# Patient Record
Sex: Female | Born: 1960 | Hispanic: Yes | Marital: Single | Smoking: Never smoker
Health system: Southern US, Community
[De-identification: ages and names within clinical notes are randomized; demographics above are authoritative.]

## PROBLEM LIST (undated history)

## (undated) DIAGNOSIS — Z789 Other specified health status: Secondary | ICD-10-CM

## (undated) DIAGNOSIS — G709 Myoneural disorder, unspecified: Secondary | ICD-10-CM

## (undated) HISTORY — DX: Other specified health status: Z78.9

## (undated) HISTORY — DX: Myoneural disorder, unspecified: G70.9

---

## 2018-12-15 ENCOUNTER — Other Ambulatory Visit: Payer: Self-pay | Admitting: Physician Assistant

## 2018-12-15 ENCOUNTER — Encounter (INDEPENDENT_AMBULATORY_CARE_PROVIDER_SITE_OTHER): Payer: Self-pay | Admitting: Vascular Surgery

## 2018-12-15 ENCOUNTER — Other Ambulatory Visit: Payer: Self-pay

## 2018-12-15 ENCOUNTER — Ambulatory Visit (INDEPENDENT_AMBULATORY_CARE_PROVIDER_SITE_OTHER): Payer: Medicaid Other | Admitting: Vascular Surgery

## 2018-12-15 ENCOUNTER — Encounter (INDEPENDENT_AMBULATORY_CARE_PROVIDER_SITE_OTHER): Payer: Self-pay

## 2018-12-15 DIAGNOSIS — M79605 Pain in left leg: Secondary | ICD-10-CM | POA: Diagnosis not present

## 2018-12-15 DIAGNOSIS — I739 Peripheral vascular disease, unspecified: Secondary | ICD-10-CM

## 2018-12-15 DIAGNOSIS — M79604 Pain in right leg: Secondary | ICD-10-CM | POA: Diagnosis not present

## 2018-12-15 DIAGNOSIS — G319 Degenerative disease of nervous system, unspecified: Secondary | ICD-10-CM

## 2018-12-18 ENCOUNTER — Other Ambulatory Visit (INDEPENDENT_AMBULATORY_CARE_PROVIDER_SITE_OTHER): Payer: Self-pay | Admitting: Vascular Surgery

## 2018-12-18 DIAGNOSIS — I739 Peripheral vascular disease, unspecified: Secondary | ICD-10-CM

## 2018-12-19 ENCOUNTER — Encounter (INDEPENDENT_AMBULATORY_CARE_PROVIDER_SITE_OTHER): Payer: Self-pay | Admitting: Nurse Practitioner

## 2018-12-19 ENCOUNTER — Other Ambulatory Visit: Payer: Self-pay

## 2018-12-19 ENCOUNTER — Ambulatory Visit (INDEPENDENT_AMBULATORY_CARE_PROVIDER_SITE_OTHER): Payer: Medicaid Other | Admitting: Nurse Practitioner

## 2018-12-19 ENCOUNTER — Ambulatory Visit (INDEPENDENT_AMBULATORY_CARE_PROVIDER_SITE_OTHER): Payer: Medicaid Other

## 2018-12-19 VITALS — BP 149/83 | HR 81 | Resp 16 | Wt 154.0 lb

## 2018-12-19 DIAGNOSIS — R600 Localized edema: Secondary | ICD-10-CM | POA: Diagnosis not present

## 2018-12-19 DIAGNOSIS — M7989 Other specified soft tissue disorders: Secondary | ICD-10-CM

## 2018-12-19 DIAGNOSIS — M79605 Pain in left leg: Secondary | ICD-10-CM | POA: Diagnosis not present

## 2018-12-19 DIAGNOSIS — I739 Peripheral vascular disease, unspecified: Secondary | ICD-10-CM

## 2018-12-19 DIAGNOSIS — M79604 Pain in right leg: Secondary | ICD-10-CM | POA: Diagnosis not present

## 2018-12-19 NOTE — Progress Notes (Signed)
SUBJECTIVE:  Patient ID: Maria Paul, female    DOB: [birth year], 58 y.o.   MRN: [id number] Chief Complaint  Patient presents with  . Follow-up    ultrasound follow up    HPI  Maria Paul is a 58 y.o. female Patient is seen for evaluation of leg pain and leg swelling. The patient first noticed the swelling remotely. The swelling is associated with pain and discoloration. The pain and swelling worsens with prolonged dependency and improves with elevation. The pain is unrelated to activity.  The patient notes that in the morning the legs are significantly improved but they steadily worsened throughout the course of the day. The patient also notes a steady worsening of the discoloration in the ankle and shin area.   The patient denies claudication symptoms.  The patient denies symptoms consistent with rest pain.  The patient denies and extensive history of DJD and LS spine disease.  The patient has no had any past angiography, interventions or vascular surgery.  Elevation makes the leg symptoms better, dependency makes them much worse. There is no history of ulcerations. The patient denies any recent changes in medications.  The patient has not been wearing graduated compression.  The patient denies a history of DVT or PE. There is no prior history of phlebitis. There is no history of primary lymphedema.  No history of malignancies. No history of trauma or groin or pelvic surgery. There is no history of radiation treatment to the groin or pelvis  The patient denies amaurosis fugax or recent TIA symptoms. There are no recent neurological changes noted. The patient denies recent episodes of angina or shortness of breath  No evidence of a abdominal aortic aneurysm was identified.  No evidence of iliac stenosis or aneurysm.  Past Medical History:  Diagnosis Date  . Medical history unknown     Past Surgical History:  Procedure Laterality Date  . CESAREAN SECTION        Social History   Socioeconomic History  . Marital status: Single    Spouse name: Not on file  . Number of children: Not on file  . Years of education: Not on file  . Highest education level: Not on file  Occupational History  . Not on file  Tobacco Use  . Smoking status: Never Smoker  . Smokeless tobacco: Never Used  Substance and Sexual Activity  . Alcohol use: Never  . Drug use: Never  . Sexual activity: Not on file  Other Topics Concern  . Not on file  Social History Narrative  . Not on file   Social Determinants of Health   Financial Resource Strain:   . Difficulty of Paying Living Expenses: Not on file  Food Insecurity:   . Worried About Charity fundraiser in the Last Year: Not on file  . Ran Out of Food in the Last Year: Not on file  Transportation Needs:   . Lack of Transportation (Medical): Not on file  . Lack of Transportation (Non-Medical): Not on file  Physical Activity:   . Days of Exercise per Week: Not on file  . Minutes of Exercise per Session: Not on file  Stress:   . Feeling of Stress : Not on file  Social Connections:   . Frequency of Communication with Friends and Family: Not on file  . Frequency of Social Gatherings with Friends and Family: Not on file  . Attends Religious Services: Not on file  . Active Member of Clubs or  Organizations: Not on file  . Attends BankerClub or Organization Meetings: Not on file  . Marital Status: Not on file  Intimate Partner Violence:   . Fear of Current or Ex-Partner: Not on file  . Emotionally Abused: Not on file  . Physically Abused: Not on file  . Sexually Abused: Not on file    Family History  Problem Relation Age of Onset  . Vascular Disease Sister   . Vascular Disease Maternal Grandfather     Allergies  Allergen Reactions  . Aspirin Swelling  . Penicillins Swelling     Review of Systems   Review of Systems: Negative Unless Checked Constitutional: [] Weight loss  [] Fever  [] Chills Cardiac:  [] Chest pain   []  Atrial Fibrillation  [] Palpitations   [] Shortness of breath when laying flat   [] Shortness of breath with exertion. [] Shortness of breath at rest Vascular:  [] Pain in legs with walking   [] Pain in legs with standing [] Pain in legs when laying flat   [] Claudication    [] Pain in feet when laying flat    [] History of DVT   [] Phlebitis   [x] Swelling in legs   [] Varicose veins   [] Non-healing ulcers Pulmonary:   [] Uses home oxygen   [] Productive cough   [] Hemoptysis   [] Wheeze  [] COPD   [] Asthma Neurologic:  [] Dizziness   [] Seizures  [] Blackouts [] History of stroke   [] History of TIA  [] Aphasia   [] Temporary Blindness   [] Weakness or numbness in arm   [x] Weakness or numbness in leg Musculoskeletal:   [] Joint swelling   [] Joint pain   [] Low back pain  []  History of Knee Replacement [] Arthritis [] back Surgeries  []  Spinal Stenosis    Hematologic:  [] Easy bruising  [] Easy bleeding   [] Hypercoagulable state   [] Anemic Gastrointestinal:  [] Diarrhea   [] Vomiting  [] Gastroesophageal reflux/heartburn   [] Difficulty swallowing. [] Abdominal pain Genitourinary:  [] Chronic kidney disease   [] Difficult urination  [] Anuric   [] Blood in urine [] Frequent urination  [] Burning with urination   [] Hematuria Skin:  [] Rashes   [] Ulcers [] Wounds Psychological:  [] History of anxiety   []  History of major depression  []  Memory Difficulties      OBJECTIVE:   Physical Exam  BP (!) 149/83 (BP Location: Right Arm)   Pulse 81   Resp 16   Wt 154 lb (69.9 kg)   Gen: WD/WN, NAD Head: [place]/AT, No temporalis wasting.  Ear/Nose/Throat: Hearing grossly intact, nares w/o erythema or drainage Eyes: PER, EOMI, sclera nonicteric.  Neck: Supple, no masses.  No JVD.  Pulmonary:  Good air movement, no use of accessory muscles.  Cardiac: RRR Vascular:  Vessel Right Left  Radial Palpable Palpable  Dorsalis Pedis Palpable Palpable  Posterior Tibial Palpable Palpable   Gastrointestinal: soft, non-distended. No  guarding/no peritoneal signs.  Musculoskeletal: M/S 5/5 throughout.  No deformity or atrophy.  Neurologic: Pain and light touch intact in extremities.  Symmetrical.  Speech is fluent. Motor exam as listed above. Psychiatric: Judgment intact, Mood & affect appropriate for pt's clinical situation. Dermatologic: No Venous rashes. No Ulcers Noted.  No changes consistent with cellulitis. Lymph : No Cervical lymphadenopathy, no lichenification or skin changes of chronic lymphedema.       ASSESSMENT AND PLAN:  1. Leg swelling I have had a long discussion with the patient regarding swelling and why it  causes symptoms.  Patient will begin wearing graduated compression stockings class 1 (20-30 mmHg) on a daily basis a prescription was given. The patient will  beginning wearing the  stockings first thing in the morning and removing them in the evening. The patient is instructed specifically not to sleep in the stockings.   In addition, behavioral modification will be initiated.  This will include frequent elevation, use of over the counter pain medications and exercise such as walking.  I have reviewed systemic causes for chronic edema such as liver, kidney and cardiac etiologies.  The patient denies problems with these organ systems.    Consideration for a lymph pump will also be made based upon the effectiveness of conservative therapy.  This would help to improve the edema control and prevent sequela such as ulcers and infections   Patient will follow up in three months .    2. Pain in both lower extremities Recommend:  I do not find evidence of Vascular pathology that would explain the patient's symptoms  The patient has atypical pain symptoms for vascular disease  I do not find evidence of Vascular pathology that would explain the patient's symptoms and I suspect the patient is c/o pseudoclaudication.  Patient should have an evaluation of his LS spine which I defer to the primary  service.  Noninvasive studies including venous ultrasound of the legs do not identify vascular problems  The patient should continue walking and begin a more formal exercise program. The patient should continue his antiplatelet therapy and aggressive treatment of the lipid abnormalities. The patient should begin wearing graduated compression socks 15-20 mmHg strength to control her mild edema.  Patient will follow-up with me on a PRN basis  Further work-up of her lower extremity pain is deferred to the primary service      Current Outpatient Medications on File Prior to Visit  Medication Sig Dispense Refill  . acetaminophen (TYLENOL) 650 MG CR tablet Take 650 mg by mouth every 8 (eight) hours as needed for pain.    . methocarbamol (ROBAXIN) 500 MG tablet Take 500 mg by mouth every 6 (six) hours as needed for muscle spasms.     No current facility-administered medications on file prior to visit.    There are no Patient Instructions on file for this visit. No follow-ups on file.   Georgiana Spinner, NP  This note was completed with Office manager.  Any errors are purely unintentional.

## 2018-12-28 ENCOUNTER — Encounter (INDEPENDENT_AMBULATORY_CARE_PROVIDER_SITE_OTHER): Payer: Self-pay | Admitting: Nurse Practitioner

## 2019-01-11 ENCOUNTER — Encounter (INDEPENDENT_AMBULATORY_CARE_PROVIDER_SITE_OTHER): Payer: Self-pay | Admitting: Vascular Surgery

## 2019-01-11 DIAGNOSIS — M79606 Pain in leg, unspecified: Secondary | ICD-10-CM | POA: Insufficient documentation

## 2019-01-11 DIAGNOSIS — I739 Peripheral vascular disease, unspecified: Secondary | ICD-10-CM | POA: Insufficient documentation

## 2019-01-11 NOTE — Progress Notes (Signed)
MRN : [id number]  Maria Paul is a 59 y.o. ([birth year]) female who presents with chief complaint of  Chief Complaint  Patient presents with  . New Patient (Initial Visit)    ref Cy Blamer for PVD  .  History of Present Illness:   The patient is seen for evaluation of painful lower extremities. Patient notes the pain is variable and not always associated with activity.  She notes the left leg is much worse than the right.  Pain radiates to the groin and hip.  She did have a fall at home and is now having back pain with her leg pain.  The pain is somewhat consistent day to day occurring on most days. The patient notes the pain also occurs with standing and routinely seems worse as the day wears on. The pain has been progressive over the past several years. The patient states these symptoms are causing  a profound negative impact on quality of life and daily activities.  The patient denies rest pain or dangling of an extremity off the side of the bed during the night for relief. No open wounds or sores at this time. No history of DVT or phlebitis. No prior interventions or surgeries.  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    Current Meds  Medication Sig  . acetaminophen (TYLENOL) 650 MG CR tablet Take 650 mg by mouth every 8 (eight) hours as needed for pain.  . methocarbamol (ROBAXIN) 500 MG tablet Take 500 mg by mouth every 6 (six) hours as needed for muscle spasms.    Past Medical History:  Diagnosis Date  . Medical history unknown     Past Surgical History:  Procedure Laterality Date  . CESAREAN SECTION      Social History Social History   Tobacco Use  . Smoking status: Never Smoker  . Smokeless tobacco: Never Used  Substance Use Topics  . Alcohol use: Never  . Drug use: Never    Family History Family History  Problem Relation Age of Onset  . Vascular Disease Sister   . Vascular Disease Maternal Grandfather   No  family history of bleeding/clotting disorders, porphyria or autoimmune disease   Allergies  Allergen Reactions  . Aspirin Swelling  . Penicillins Swelling     REVIEW OF SYSTEMS (Negative unless checked)  Constitutional: [] Weight loss  [] Fever  [] Chills Cardiac: [] Chest pain   [] Chest pressure   [] Palpitations   [] Shortness of breath when laying flat   [] Shortness of breath with exertion. Vascular:  [x] Pain in legs with walking   [x] Pain in legs at rest  [] History of DVT   [] Phlebitis   [] Swelling in legs   [] Varicose veins   [] Non-healing ulcers Pulmonary:   [] Uses home oxygen   [] Productive cough   [] Hemoptysis   [] Wheeze  [] COPD   [] Asthma Neurologic:  [] Dizziness   [] Seizures   [] History of stroke   [] History of TIA  [] Aphasia   [] Vissual changes   [] Weakness or numbness in arm   [] Weakness or numbness in leg Musculoskeletal:   [] Joint swelling   [x] Joint pain   [x] Low back pain Hematologic:  [] Easy bruising  [] Easy bleeding   [] Hypercoagulable state   [] Anemic Gastrointestinal:  [] Diarrhea   [] Vomiting  [] Gastroesophageal reflux/heartburn   [] Difficulty swallowing. Genitourinary:  [] Chronic kidney disease   [] Difficult urination  [] Frequent urination   [] Blood in urine Skin:  [] Rashes   [] Ulcers  Psychological:  [] History  of anxiety   []  History of major depression.  Physical Examination  Vitals:   12/15/18 1434  BP: (!) 142/89  Pulse: 86  Resp: 16  Weight: 155 lb 6.4 oz (70.5 kg)   There is no height or weight on file to calculate BMI. Gen: WD/WN, NAD Head: [place]/AT, No temporalis wasting.  Ear/Nose/Throat: Hearing grossly intact, nares w/o erythema or drainage, poor dentition Eyes: PER, EOMI, sclera nonicteric.  Neck: Supple, no masses.  No bruit or JVD.  Pulmonary:  Good air movement, clear to auscultation bilaterally, no use of accessory muscles.  Cardiac: RRR, normal S1, S2, no Murmurs. Vascular: mild edema Vessel Right Left  Radial Palpable Palpable  PT Palpable  Palpable  DP Palpable Palpable  Gastrointestinal: soft, non-distended. No guarding/no peritoneal signs.  Musculoskeletal: M/S 5/5 throughout.  No deformity or atrophy.  Neurologic: CN 2-12 intact. Pain and light touch intact in extremities.  Symmetrical.  Speech is fluent. Motor exam as listed above. Psychiatric: Judgment intact, Mood & affect appropriate for pt's clinical situation. Dermatologic: No rashes or ulcers noted.  No changes consistent with cellulitis. Lymph : No Cervical lymphadenopathy, no lichenification or skin changes of chronic lymphedema.  CBC No results found for: WBC, HGB, HCT, MCV, PLT  BMET No results found for: NA, K, CL, CO2, GLUCOSE, BUN, CREATININE, CALCIUM, GFRNONAA, GFRAA CrCl cannot be calculated (No successful lab value found.).  COAG No results found for: INR, PROTIME  Radiology Aorto-Iliac Duplex  Result Date: 12/22/2018 ABDOMINAL AORTA STUDY Indications: PAD Limitations: Air/bowel gas.  Performing Technologist: 12/24/2018 RT (R)(VS)  Examination Guidelines: A complete evaluation includes B-mode imaging, spectral Doppler, color Doppler, and power Doppler as needed of all accessible portions of each vessel. Bilateral testing is considered an integral part of a complete examination. Limited examinations for reoccurring indications may be performed as noted.  Abdominal Aorta Findings: +-----------+-------+----------+----------+--------+--------+--------+ Location   AP (cm)Trans (cm)PSV (cm/s)WaveformThrombusComments +-----------+-------+----------+----------+--------+--------+--------+ Proximal   1.90   1.72      66                                 +-----------+-------+----------+----------+--------+--------+--------+ Mid        1.70   1.78      88                                 +-----------+-------+----------+----------+--------+--------+--------+ Distal     1.36   1.32      108                                 +-----------+-------+----------+----------+--------+--------+--------+ RT CIA Prox0.9    0.8       145                                +-----------+-------+----------+----------+--------+--------+--------+ RT EIA Prox0.8    0.8       87                                 +-----------+-------+----------+----------+--------+--------+--------+ LT CIA Prox0.9    0.9       157                                +-----------+-------+----------+----------+--------+--------+--------+  LT EIA Prox0.7    0.8       132                                +-----------+-------+----------+----------+--------+--------+--------+  Summary: Abdominal Aorta: No evidence of an abdominal aortic aneurysm was visualized.  *See table(s) above for measurements and observations.  Electronically signed by Hortencia Pilar MD on 12/22/2018 at 5:26:05 PM.   Final      Assessment/Plan 1. Pain in both lower extremities  Recommend:  The patient has atypical pain symptoms for pure atherosclerotic disease. However, on physical exam there is evidence of mixed venous and arterial disease, given the diminished pulses and the edema associated with venous changes of the legs.  Noninvasive studies including ABI's and venous ultrasound of the legs will be obtained and the patient will follow up with me to review these studies.  I suspect the patient is c/o pseudoclaudication.  Patient should have an evaluation of his LS spine which I defer to the primary service.  The patient should continue walking and begin a more formal exercise program. The patient should continue his antiplatelet therapy and aggressive treatment of the lipid abnormalities.  The patient should begin wearing graduated compression socks 15-20 mmHg strength to control edema.   2. PAD (peripheral artery disease) (HCC)  Recommend:  The patient has atypical pain symptoms for pure atherosclerotic disease. However, on physical exam there is evidence of  mixed venous and arterial disease, given the diminished pulses and the edema associated with venous changes of the legs.  Noninvasive studies including ABI's and venous ultrasound of the legs will be obtained and the patient will follow up with me to review these studies.  I suspect the patient is c/o pseudoclaudication.  Patient should have an evaluation of his LS spine which I defer to the primary service.  The patient should continue walking and begin a more formal exercise program. The patient should continue his antiplatelet therapy and aggressive treatment of the lipid abnormalities.  The patient should begin wearing graduated compression socks 15-20 mmHg strength to control edema.     Hortencia Pilar, MD  01/11/2019 11:42 AM

## 2019-01-16 ENCOUNTER — Ambulatory Visit: Payer: Medicaid Other

## 2019-01-26 ENCOUNTER — Ambulatory Visit: Admission: RE | Admit: 2019-01-26 | Payer: Medicaid Other | Source: Ambulatory Visit

## 2019-02-10 ENCOUNTER — Other Ambulatory Visit: Payer: Self-pay | Admitting: Physician Assistant

## 2019-02-10 DIAGNOSIS — G319 Degenerative disease of nervous system, unspecified: Secondary | ICD-10-CM

## 2019-03-10 ENCOUNTER — Other Ambulatory Visit (HOSPITAL_COMMUNITY): Payer: Self-pay | Admitting: Preventative Medicine

## 2019-03-10 DIAGNOSIS — Z1231 Encounter for screening mammogram for malignant neoplasm of breast: Secondary | ICD-10-CM

## 2019-03-19 ENCOUNTER — Other Ambulatory Visit: Payer: Self-pay

## 2019-03-19 ENCOUNTER — Ambulatory Visit
Admission: RE | Admit: 2019-03-19 | Discharge: 2019-03-19 | Disposition: A | Discharge status: Home / Self Care | Payer: Medicaid Other | Source: Ambulatory Visit | Attending: Physician Assistant | Admitting: Physician Assistant

## 2019-03-19 DIAGNOSIS — G319 Degenerative disease of nervous system, unspecified: Secondary | ICD-10-CM | POA: Insufficient documentation

## 2019-03-19 LAB — POCT I-STAT CREATININE: Creatinine, Ser: 0.7 mg/dL (ref 0.44–1.00)

## 2019-03-19 MED ORDER — GADOBUTROL 1 MMOL/ML IV SOLN
7.0000 mL | Freq: Once | INTRAVENOUS | Status: AC | PRN
  Administered 2019-03-19: 7 mL via INTRAVENOUS

## 2019-03-20 ENCOUNTER — Ambulatory Visit (HOSPITAL_COMMUNITY): Payer: Medicaid Other

## 2019-03-20 ENCOUNTER — Other Ambulatory Visit: Payer: Self-pay | Admitting: Physician Assistant

## 2019-03-20 ENCOUNTER — Ambulatory Visit
Admission: RE | Admit: 2019-03-20 | Discharge: 2019-03-20 | Disposition: A | Discharge status: Home / Self Care | Payer: Medicaid Other | Source: Ambulatory Visit | Attending: Physician Assistant | Admitting: Physician Assistant

## 2019-03-20 DIAGNOSIS — Z1231 Encounter for screening mammogram for malignant neoplasm of breast: Secondary | ICD-10-CM | POA: Diagnosis present

## 2019-03-23 ENCOUNTER — Encounter (INDEPENDENT_AMBULATORY_CARE_PROVIDER_SITE_OTHER): Payer: Self-pay | Admitting: Vascular Surgery

## 2019-03-23 ENCOUNTER — Other Ambulatory Visit: Payer: Self-pay

## 2019-03-23 ENCOUNTER — Ambulatory Visit (INDEPENDENT_AMBULATORY_CARE_PROVIDER_SITE_OTHER): Payer: Medicaid Other | Admitting: Vascular Surgery

## 2019-03-23 DIAGNOSIS — I872 Venous insufficiency (chronic) (peripheral): Secondary | ICD-10-CM | POA: Insufficient documentation

## 2019-03-23 DIAGNOSIS — I890 Lymphedema, not elsewhere classified: Secondary | ICD-10-CM | POA: Insufficient documentation

## 2019-03-23 NOTE — Progress Notes (Signed)
MRN : [id number]  Maria Paul is a 59 y.o. (01-28-1960) female who presents with chief complaint of  Chief Complaint  Patient presents with  . Follow-up    32mo Follow up  .  History of Present Illness:   The patient returns to the office for followup evaluation regarding leg swelling.  The swelling has improved quite a bit and the pain associated with swelling has decreased substantially. There have not been any interval development of a ulcerations or wounds.  Since the previous visit the patient has been wearing graduated compression stockings and has noted little significant improvement in the lymphedema. The patient has been using compression routinely morning until night.  The patient also states elevation during the day and exercise is being done too.     Current Meds  Medication Sig  . acetaminophen (TYLENOL) 650 MG CR tablet Take 650 mg by mouth every 8 (eight) hours as needed for pain.  . carbidopa-levodopa (SINEMET IR) 25-100 MG tablet Start 1/2 tablet three times/day (at wake up, lunch, and dinner). After 2 weeks, increase to 1 tablet three times/day.  . methocarbamol (ROBAXIN) 500 MG tablet Take 500 mg by mouth every 6 (six) hours as needed for muscle spasms.    Past Medical History:  Diagnosis Date  . Medical history unknown   . Neuromuscular disorder [facility])     Past Surgical History:  Procedure Laterality Date  . CESAREAN SECTION      Social History Social History   Tobacco Use  . Smoking status: Never Smoker  . Smokeless tobacco: Never Used  Substance Use Topics  . Alcohol use: Never  . Drug use: Never    Family History Family History  Problem Relation Age of Onset  . Vascular Disease Sister   . Vascular Disease Maternal Grandfather     Allergies  Allergen Reactions  . Aspirin Swelling  . Penicillins Swelling     REVIEW OF SYSTEMS (Negative unless checked)  Constitutional: [] Weight loss  [] Fever  [] Chills Cardiac: [] Chest  pain   [] Chest pressure   [] Palpitations   [] Shortness of breath when laying flat   [] Shortness of breath with exertion. Vascular:  [] Pain in legs with walking   [] Pain in legs at rest  [] History of DVT   [] Phlebitis   [x] Swelling in legs   [] Varicose veins   [] Non-healing ulcers Pulmonary:   [] Uses home oxygen   [] Productive cough   [] Hemoptysis   [] Wheeze  [] COPD   [] Asthma Neurologic:  [] Dizziness   [] Seizures   [] History of stroke   [] History of TIA  [] Aphasia   [] Vissual changes   [] Weakness or numbness in arm   [] Weakness or numbness in leg Musculoskeletal:   [] Joint swelling   [] Joint pain   [] Low back pain Hematologic:  [] Easy bruising  [] Easy bleeding   [] Hypercoagulable state   [] Anemic Gastrointestinal:  [] Diarrhea   [] Vomiting  [] Gastroesophageal reflux/heartburn   [] Difficulty swallowing. Genitourinary:  [] Chronic kidney disease   [] Difficult urination  [] Frequent urination   [] Blood in urine Skin:  [] Rashes   [] Ulcers  Psychological:  [] History of anxiety   []  History of major depression.  Physical Examination  Vitals:   03/23/19 1410  BP: (!) 160/83  Pulse: 67  Weight: 167 lb (75.8 kg)  Height: 5\' 2"  (1.575 m)   Body mass index is 30.54 kg/m. Gen: WD/WN, NAD Head: [place]/AT, No temporalis wasting.  Ear/Nose/Throat: Hearing grossly intact, nares w/o erythema or drainage Eyes: PER, EOMI, sclera nonicteric.  Neck: Supple, no large masses.   Pulmonary:  Good air movement, no audible wheezing bilaterally, no use of accessory muscles.  Cardiac: RRR, no JVD Vascular: scattered varicosities present bilaterally.  Mild venous stasis changes to the legs bilaterally.  1-2+ soft pitting edema. Gastrointestinal: Non-distended. No guarding/no peritoneal signs.  Musculoskeletal: M/S 5/5 throughout.  No deformity or atrophy.  Neurologic: CN 2-12 intact. Symmetrical.  Speech is fluent. Motor exam as listed above. Psychiatric: Judgment intact, Mood & affect appropriate for pt's clinical  situation. Dermatologic: No rashes or ulcers noted.  No changes consistent with cellulitis.   CBC No results found for: WBC, HGB, HCT, MCV, PLT  BMET    Component Value Date/Time   CREATININE 0.70 03/19/2019 1935   Estimated Creatinine Clearance: 71.3 mL/min (by C-G formula based on SCr of 0.7 mg/dL).  COAG No results found for: INR, PROTIME  Radiology MR BRAIN W WO CONTRAST  Result Date: 03/20/2019 CLINICAL DATA:  Diagnosed with Parkinson's 2 months ago EXAM: MRI HEAD WITHOUT AND WITH CONTRAST TECHNIQUE: Multiplanar, multiecho pulse sequences of the brain and surrounding structures were obtained without and with intravenous contrast. CONTRAST:  3mL GADAVIST GADOBUTROL 1 MMOL/ML IV SOLN COMPARISON:  None. FINDINGS: Brain: No infarction, hemorrhage, hydrocephalus, extra-axial collection or mass lesion. Nonspecific dural calcification and ossification that is incidental. Brain volume is normal. Minimal periventricular FLAIR hyperintensity, essentially age congruent. Vascular: Normal flow voids and vascular enhancements Skull and upper cervical spine: Normal marrow signal Sinuses/Orbits: Negative IMPRESSION: Unremarkable brain MRI. Electronically Signed   By: Marnee Spring M.D.   On: 03/20/2019 09:26   MM 3D SCREEN BREAST BILATERAL  Result Date: 03/20/2019 CLINICAL DATA:  Screening. EXAM: DIGITAL SCREENING BILATERAL MAMMOGRAM WITH TOMO AND CAD COMPARISON:  None. ACR Breast Density Category a: The breast tissue is almost entirely fatty. FINDINGS: In the left breast, a possible mass warrants further evaluation. In the right breast, no findings suspicious for malignancy. Images were processed with CAD. IMPRESSION: Further evaluation is suggested for possible mass in the left breast. RECOMMENDATION: Diagnostic mammogram and possibly ultrasound of the left breast. (Code:[id number]) The patient will be contacted regarding the findings, and additional imaging will be scheduled. BI-RADS CATEGORY  0:  Incomplete. Need additional imaging evaluation and/or prior mammograms for comparison. Electronically Signed   By: Frederico Hamman M.D.   On: 03/20/2019 15:55     Assessment/Plan 1. Chronic venous insufficiency No surgery or intervention at this point in time.  I have reviewed my discussion with the patient regarding venous insufficiency and why it causes symptoms. I have discussed with the patient the chronic skin changes that accompany venous insufficiency and the long term sequela such as ulceration. Patient will contnue wearing graduated compression stockings on a daily basis, as this has provided excellent control of his edema. The patient will put the stockings on first thing in the morning and removing them in the evening. The patient is reminded not to sleep in the stockings.  In addition, behavioral modification including elevation during the day will be initiated. Exercise is strongly encouraged.  Given the patient's good control and lack of any problems regarding the venous insufficiency and lymphedema a lymph pump in not need at this time.  The patient will follow up with me PRN should anything change.  The patient voices agreement with this plan.   2. Lymphedema No surgery or intervention at this point in time.  I have reviewed my discussion with the patient regarding venous insufficiency and why it causes symptoms.  I have discussed with the patient the chronic skin changes that accompany venous insufficiency and the long term sequela such as ulceration. Patient will contnue wearing graduated compression stockings on a daily basis, as this has provided excellent control of his edema. The patient will put the stockings on first thing in the morning and removing them in the evening. The patient is reminded not to sleep in the stockings.  In addition, behavioral modification including elevation during the day will be initiated. Exercise is strongly encouraged.  Given the patient's  good control and lack of any problems regarding the venous insufficiency and lymphedema a lymph pump in not need at this time.  The patient will follow up with me PRN should anything change.  The patient voices agreement with this plan.    Hortencia Pilar, MD  03/23/2019 2:23 PM

## 2019-03-30 ENCOUNTER — Other Ambulatory Visit: Payer: Self-pay | Admitting: Physician Assistant

## 2019-03-30 DIAGNOSIS — N6320 Unspecified lump in the left breast, unspecified quadrant: Secondary | ICD-10-CM

## 2019-03-30 DIAGNOSIS — R928 Other abnormal and inconclusive findings on diagnostic imaging of breast: Secondary | ICD-10-CM

## 2020-10-27 IMAGING — MG DIGITAL SCREENING BILAT W/ TOMO W/ CAD
6 of 10 series · 6 of 30 positions shown · non-contrast
Comparison: None.

ACR Breast Density Category a: The breast tissue is almost entirely
fatty.

CLINICAL DATA: Screening.

EXAM:
DIGITAL SCREENING BILATERAL MAMMOGRAM WITH TOMO AND CAD

[R MLO synth-2D (1 of 2)]
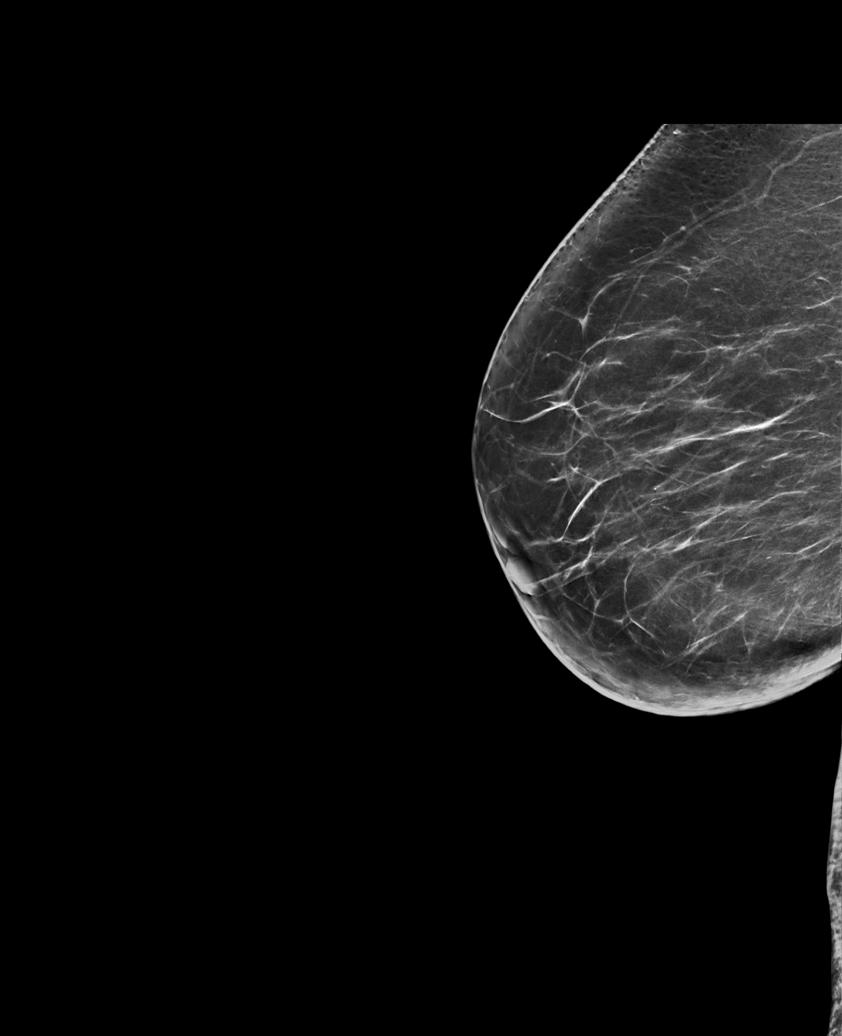

[L MLO synth-2D]
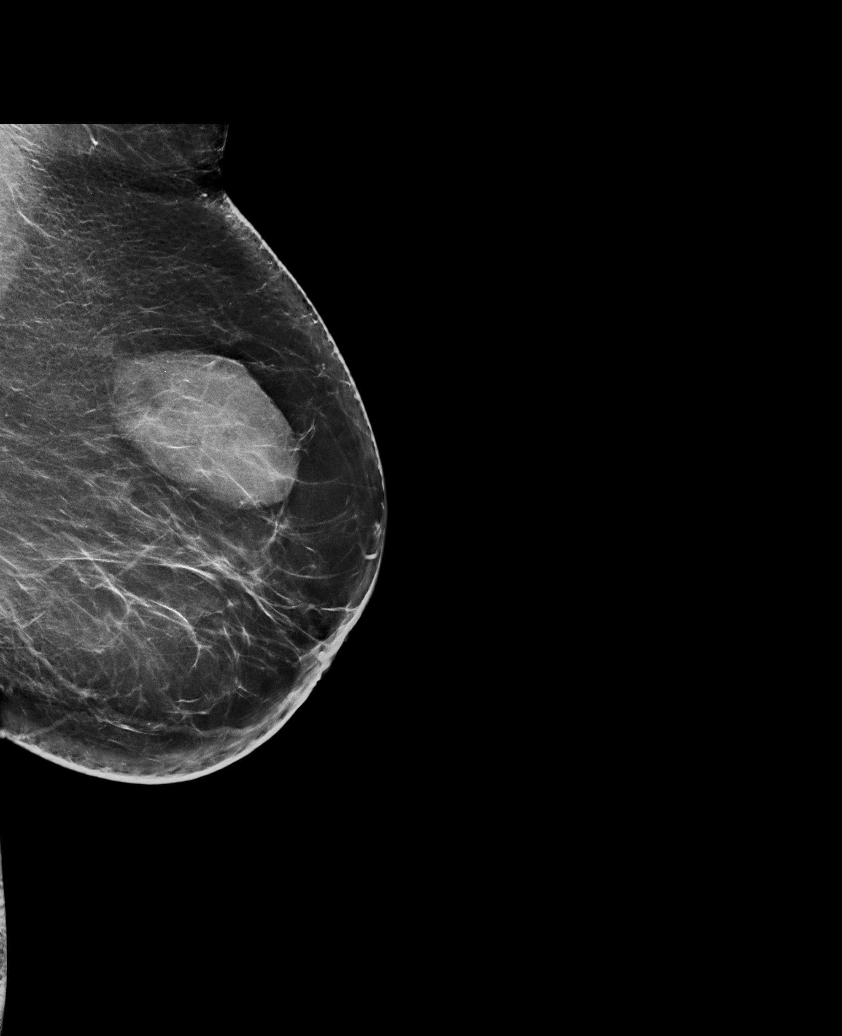

[L CC synth-2D]
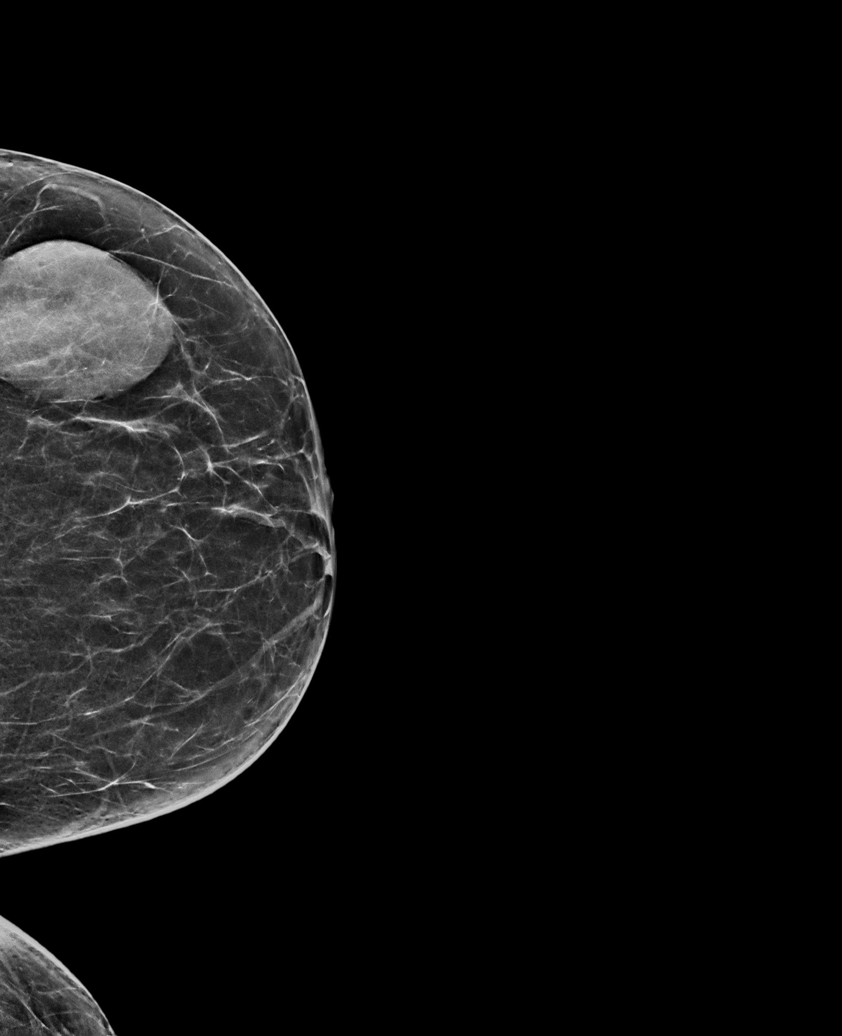

[R MLO synth-2D (2 of 2)]
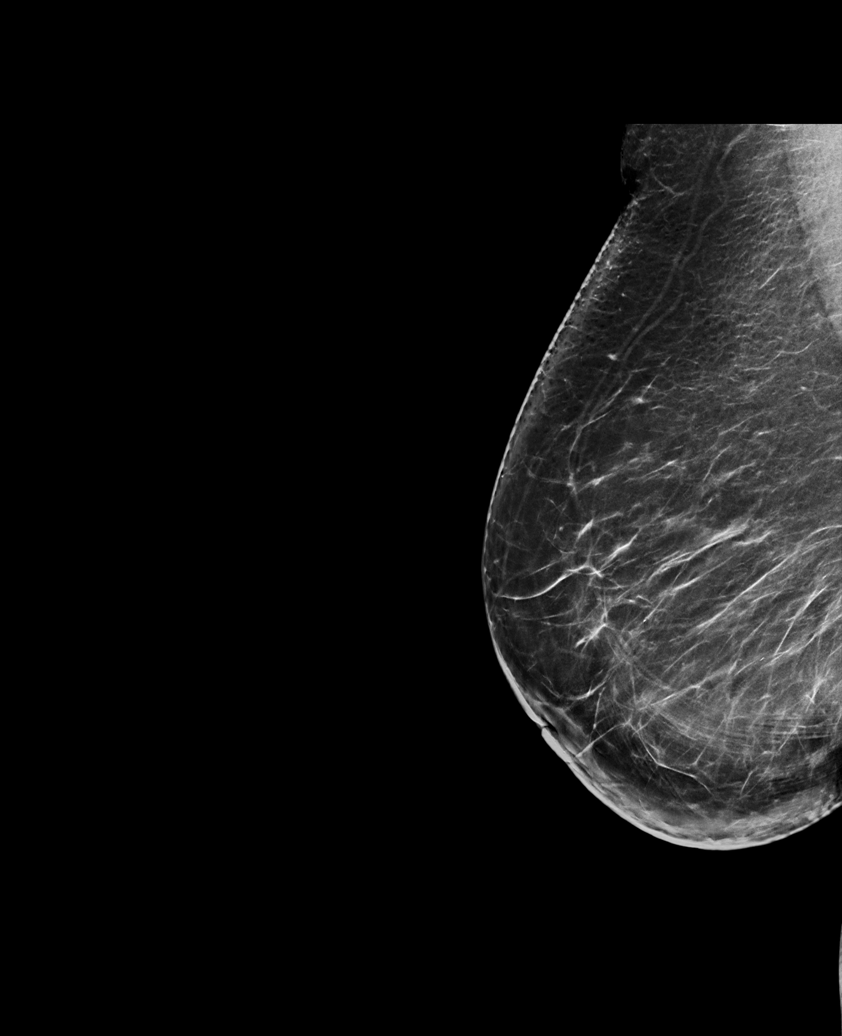

[R CC synth-2D]
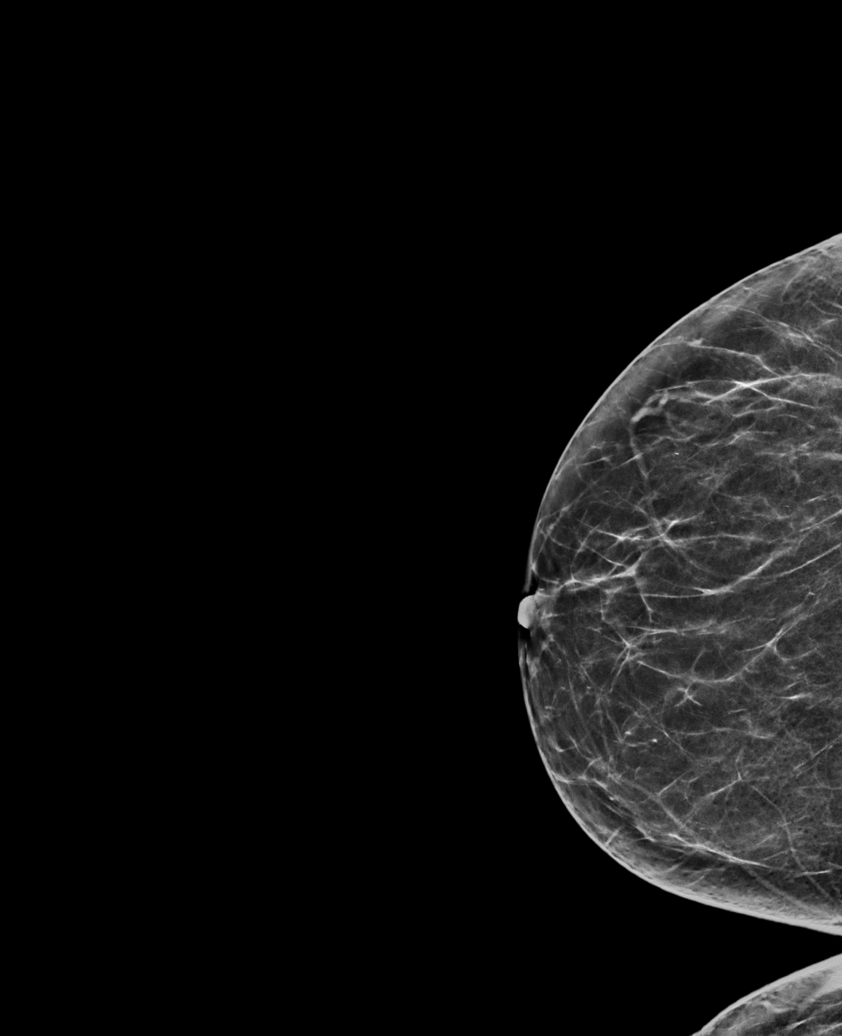

[R MLO tomo · tomo slice 41/81.0]
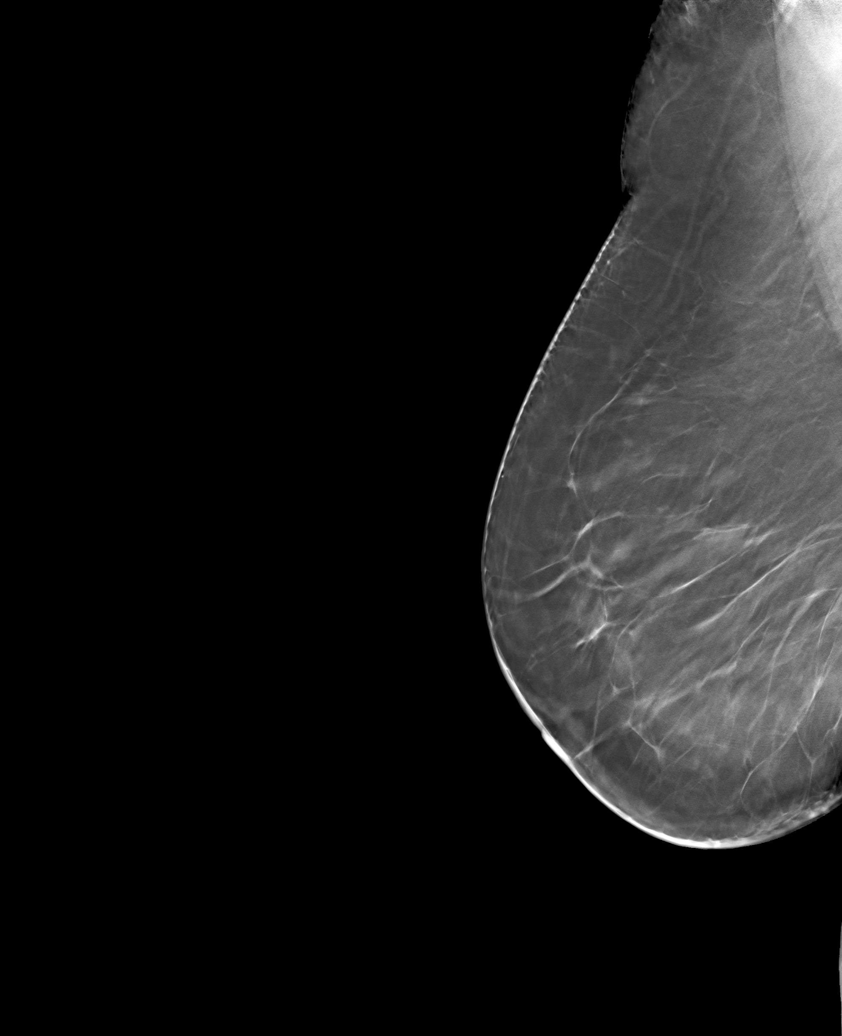

[6 of 30 positions shown; findings below may reference images not displayed]

FINDINGS: In the left breast, a possible mass warrants further evaluation. In
the right breast, no findings suspicious for malignancy.

Images were processed with CAD.
IMPRESSION: Further evaluation is suggested for possible mass in the left
breast.

RECOMMENDATION:
Diagnostic mammogram and possibly ultrasound of the left breast.
(Code:[id number])

The patient will be contacted regarding the findings, and additional
imaging will be scheduled.

BI-RADS CATEGORY  0: Incomplete. Need additional imaging evaluation
and/or prior mammograms for comparison.
